# Patient Record
Sex: Female | Born: 2005 | Race: Black or African American | Hispanic: No | Marital: Single | Smoking: Never smoker
Health system: Southern US, Community
[De-identification: ages and names within clinical notes are randomized; demographics above are authoritative.]

## PROBLEM LIST (undated history)

## (undated) DIAGNOSIS — Z789 Other specified health status: Secondary | ICD-10-CM

## (undated) HISTORY — PX: TYMPANOSTOMY TUBE PLACEMENT: SHX32

---
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
## 2024-04-02 NOTE — Progress Notes (Signed)
 Chief Complaint:   Chief Complaint  Patient presents with  . Nasal Congestion    Pressure in frontal sinus just 2 days. Congested for a few weeks.    Subjective:   Adella Manolis is a 18 y.o. female who is here today with her  mother and brother  for persistent sinus congestion and HA  after recent antibiotics.  History of Present Illness Shanaya Helfand is a 18 year old female who presents with persistent sinus symptoms and headaches. She is accompanied by her mother.  She has been experiencing recurrent sinus issues for the past year and a half, with multiple treatments including antibiotics such as Z-Pak, doxycycline, and Augmentin. Symptoms include an uncomfortable cold feeling in her head, tenderness, and a sore throat. She has also experienced nausea and a significant weight loss of 13 pounds over the past year, and reports a loss of appetite.  She has chronic seasonal allergies, primarily to pollen and dust, which have worsened since moving back from [place] . She uses a generic form of Zyrtec  and a nasal steroid spray once daily, which provides some relief. She recently started using a Aureliano Med sinus rinse, which she used once this week, but it caused some eye irritation and burning; she believes this was because she did not mix the solution thoroughly.  She has been experiencing daily headaches for the past month, with some associated vision changes such as blurriness in her right eye. She recently got new glasses due to a change in her prescription, which now includes astigmatism in both eyes. She reports feeling taller with the new glasses and notices clearer vision.  No fever, cough, or ear pain, but she reports some ear pressure. She does not have a history of wheezing or needing an inhaler. She works from home and is not frequently around young children. She has not experienced any significant nasal discharge despite feeling congested.  Chart review: antibx treatment received  for sinusitis July 2024 Augmentin Aug 2025 Augmentin- pt reported itching palms and loose stool on augmentin- changed to doxycycline Oct 2025 Doxycycline, changed to azithromycin due to vomiting. Pt requested ENT referral, has appt in [facility] in early Dec. PCP is [facility]  Patient's past medical, social, surgical, and medication history were reviewed by me and updated in Epic.  Patient Active Problem List  Diagnosis  . Gastroesophageal reflux disease without esophagitis  . Seasonal allergies  . Migraine with aura and without status migrainosus, not intractable    No outpatient medications have been marked as taking for the 04/02/24 encounter (Urgent Care Visit) with Manning Vina Hopkins, MD.    Allergies to:  Augmentin [amoxicillin-pot clavulanate], Ibuprofen, and Shellfish containing products  No past medical history on file.   ROS See HPI    Objective:   Vitals:   04/02/24 1742  BP: 132/84  Pulse: 109  Resp: 18  Temp: 37.1 C (98.7 F)  TempSrc: Oral  SpO2: 100%  Weight: 80.4 kg (177 lb 4 oz)     Physical Exam Vitals reviewed.  Constitutional:      General: She is not in acute distress.    Comments: soft spoken, flat affect  HENT:     Head: Normocephalic.     Comments: pt reports tenderness of frontal sinuses w palpation    Right Ear: Tympanic membrane normal.     Left Ear: Tympanic membrane normal.     Nose: Congestion present.     Mouth/Throat:     Mouth: Mucous membranes  are moist.     Pharynx: No oropharyngeal exudate or posterior oropharyngeal erythema.  Eyes:     Extraocular Movements: Extraocular movements intact.     Conjunctiva/sclera: Conjunctivae normal.     Pupils: Pupils are equal, round, and reactive to light.  Cardiovascular:     Heart sounds: Normal heart sounds. No murmur heard.    Comments: HR mildly elevated Pulmonary:     Effort: Pulmonary effort is normal.     Breath sounds: Normal breath sounds.  Musculoskeletal:         General: Normal range of motion.     Cervical back: Normal range of motion and neck supple. No rigidity.  Lymphadenopathy:     Cervical: No cervical adenopathy.  Skin:    General: Skin is warm.     Capillary Refill: Capillary refill takes less than 2 seconds.  Neurological:     General: No focal deficit present.     Mental Status: She is alert.       No results found for this visit on 04/02/24.     Assessment/Plan:    Takeria Nicoletti was evaluated for recurrent concern for chronic sinusitis . Recommended treatment with nasal saline rinses qd-bid , advised to use distilled water. Advised to consider nasal steroid bid ( currently qd) OTC pain reliever as needed. Rx for cefuroxime 500 mb bid for 10 days. She has previously scheduled appt with her PCP in 2 days and ENT consultation in 2-3 weeks.     ICD-10-CM   1. Recurrent sinusitis  J32.9 cefUROXime (CEFTIN) 500 MG tablet    2. Seasonal allergies  J30.2     3. Elevated blood pressure reading without diagnosis of hypertension  R03.0 cefUROXime (CEFTIN) 500 MG tablet    4. Recurrent headache  R51.9      Of note, blood pressure is elevated during today's visit.  I suspect that acute pain contributes. They do not have a prior diagnosis of hypertension. Prior readings reviewed, no pattern of elevation.  I have recommended that the patient recheck BP at next visit and seek further follow up if remaining elevated. Follow up closely with PCP to recheck BP and provide further intervention if indicated.      I spent a total of 30 minutes in both face-to-face and non-face-to-face activities, excluding procedures performed, for this visit on the date of this encounter.     Indications for return to primary care provider or urgent care were discussed, the patient or patient's guardian verbalized understanding.   FOLLOW-UP:  Get re-examined if not improving within a few days or if your symptoms worsen. we generally recommend  that you follow up in the office of your primary care provider.  Future Appointments     Date/Time Provider Department Center Visit Type   04/04/2024 3:00 PM (Arrive by 2:45 PM) Dyana Vikki Gentile, DO [facility] PHYS[facility]       If your condition suddenly worsens, go to the nearest hospital Emergency Department. A copy of these instructions have been given to the patient or responsible adult who demonstratedthe ability to learn, asked appropriate questions, and verbalized understanding of the plan of care. There were no barriers to learning identified.     Dr.Paula Paradis Pediatrician [facility]
